# Patient Record
Sex: Female | Born: 1965 | Race: White | Hispanic: No | Marital: Single | Smoking: Never smoker
Health system: Southern US, Community
[De-identification: ages and names within clinical notes are randomized; demographics above are authoritative.]

## PROBLEM LIST (undated history)

## (undated) DIAGNOSIS — I341 Nonrheumatic mitral (valve) prolapse: Secondary | ICD-10-CM

## (undated) DIAGNOSIS — N926 Irregular menstruation, unspecified: Secondary | ICD-10-CM

## (undated) HISTORY — DX: Irregular menstruation, unspecified: N92.6

## (undated) HISTORY — DX: Nonrheumatic mitral (valve) prolapse: I34.1

---

## 1998-11-19 HISTORY — PX: OTHER SURGICAL HISTORY: SHX169

## 1999-02-15 ENCOUNTER — Other Ambulatory Visit: Admission: RE | Admit: 1999-02-15 | Discharge: 1999-02-15 | Payer: Self-pay | Admitting: Obstetrics and Gynecology

## 1999-05-01 ENCOUNTER — Inpatient Hospital Stay (HOSPITAL_COMMUNITY): Admission: RE | Admit: 1999-05-01 | Discharge: 1999-05-03 | Payer: Self-pay | Admitting: Obstetrics and Gynecology

## 2013-09-22 ENCOUNTER — Ambulatory Visit (INDEPENDENT_AMBULATORY_CARE_PROVIDER_SITE_OTHER): Payer: BC Managed Care – PPO | Admitting: Family Medicine

## 2013-09-22 ENCOUNTER — Encounter: Payer: Self-pay | Admitting: Family Medicine

## 2013-09-22 VITALS — BP 118/68 | HR 72 | Temp 98.1°F | Resp 16 | Ht 67.0 in | Wt 191.0 lb

## 2013-09-22 DIAGNOSIS — N926 Irregular menstruation, unspecified: Secondary | ICD-10-CM

## 2013-09-22 DIAGNOSIS — B029 Zoster without complications: Secondary | ICD-10-CM

## 2013-09-22 DIAGNOSIS — R5381 Other malaise: Secondary | ICD-10-CM

## 2013-09-22 LAB — CBC WITH DIFFERENTIAL/PLATELET
Basophils Absolute: 0 10*3/uL (ref 0.0–0.1)
Eosinophils Absolute: 0.2 10*3/uL (ref 0.0–0.7)
Eosinophils Relative: 3 % (ref 0–5)
HCT: 39.9 % (ref 36.0–46.0)
Lymphocytes Relative: 28 % (ref 12–46)
Lymphs Abs: 2 10*3/uL (ref 0.7–4.0)
MCHC: 32.3 g/dL (ref 30.0–36.0)
MCV: 80.9 fL (ref 78.0–100.0)
Monocytes Absolute: 0.5 10*3/uL (ref 0.1–1.0)
Neutrophils Relative %: 62 % (ref 43–77)
Platelets: 311 10*3/uL (ref 150–400)
RBC: 4.93 MIL/uL (ref 3.87–5.11)
RDW: 17.5 % — ABNORMAL HIGH (ref 11.5–15.5)
WBC: 7 10*3/uL (ref 4.0–10.5)

## 2013-09-22 LAB — COMPREHENSIVE METABOLIC PANEL
ALT: 20 U/L (ref 0–35)
AST: 21 U/L (ref 0–37)
Albumin: 4.3 g/dL (ref 3.5–5.2)
BUN: 14 mg/dL (ref 6–23)
CO2: 23 mEq/L (ref 19–32)
Calcium: 9.7 mg/dL (ref 8.4–10.5)
Chloride: 102 mEq/L (ref 96–112)
Creat: 0.94 mg/dL (ref 0.50–1.10)
Sodium: 136 mEq/L (ref 135–145)
Total Bilirubin: 0.3 mg/dL (ref 0.3–1.2)

## 2013-09-22 LAB — POCT URINE PREGNANCY: Preg Test, Ur: NEGATIVE

## 2013-09-22 LAB — TSH: TSH: 5.287 u[IU]/mL — ABNORMAL HIGH (ref 0.350–4.500)

## 2013-09-22 MED ORDER — GABAPENTIN 300 MG PO CAPS: ORAL_CAPSULE | ORAL | Status: DC

## 2013-09-22 MED ORDER — PREDNISONE 20 MG PO TABS: ORAL_TABLET | ORAL | Status: DC

## 2013-09-22 MED ORDER — VALACYCLOVIR HCL 1 G PO TABS: 1000.0000 mg | ORAL_TABLET | Freq: Three times a day (TID) | ORAL | Status: DC

## 2013-09-22 NOTE — Patient Instructions (Signed)
Shingles Shingles (herpes zoster) is an infection that is caused by the same virus that causes chickenpox (varicella). The infection causes a painful skin rash and fluid-filled blisters, which eventually break open, crust over, and heal. It may occur in any area of the body, but it usually affects only one side of the body or face. The pain of shingles usually lasts about 1 month. However, some people with shingles may develop long-term (chronic) pain in the affected area of the body. Shingles often occurs many years after the person had chickenpox. It is more common:  In people older than 50 years.  In people with weakened immune systems, such as those with HIV, AIDS, or cancer.  In people taking medicines that weaken the immune system, such as transplant medicines.  In people under great stress. CAUSES  Shingles is caused by the varicella zoster virus (VZV), which also causes chickenpox. After a person is infected with the virus, it can remain in the person's body for years in an inactive state (dormant). To cause shingles, the virus reactivates and breaks out as an infection in a nerve root. The virus can be spread from person to person (contagious) through contact with open blisters of the shingles rash. It will only spread to people who have not had chickenpox. When these people are exposed to the virus, they may develop chickenpox. They will not develop shingles. Once the blisters scab over, the person is no longer contagious and cannot spread the virus to others. SYMPTOMS  Shingles shows up in stages. The initial symptoms may be pain, itching, and tingling in an area of the skin. This pain is usually described as burning, stabbing, or throbbing.In a few days or weeks, a painful red rash will appear in the area where the pain, itching, and tingling were felt. The rash is usually on one side of the body in a band or belt-like pattern. Then, the rash usually turns into fluid-filled blisters. They  will scab over and dry up in approximately 2 3 weeks. Flu-like symptoms may also occur with the initial symptoms, the rash, or the blisters. These may include:  Fever.  Chills.  Headache.  Upset stomach. DIAGNOSIS  Your caregiver will perform a skin exam to diagnose shingles. Skin scrapings or fluid samples may also be taken from the blisters. This sample will be examined under a microscope or sent to a lab for further testing. TREATMENT  There is no specific cure for shingles. Your caregiver will likely prescribe medicines to help you manage the pain, recover faster, and avoid long-term problems. This may include antiviral drugs, anti-inflammatory drugs, and pain medicines. HOME CARE INSTRUCTIONS   Take a cool bath or apply cool compresses to the area of the rash or blisters as directed. This may help with the pain and itching.   Only take over-the-counter or prescription medicines as directed by your caregiver.   Rest as directed by your caregiver.  Keep your rash and blisters clean with mild soap and cool water or as directed by your caregiver.  Do not pick your blisters or scratch your rash. Apply an anti-itch cream or numbing creams to the affected area as directed by your caregiver.  Keep your shingles rash covered with a loose bandage (dressing).  Avoid skin contact with:  Babies.   Pregnant women.   Children with eczema.   Elderly people with transplants.   People with chronic illnesses, such as leukemia or AIDS.   Wear loose-fitting clothing to help ease   the pain of material rubbing against the rash.  Keep all follow-up appointments with your caregiver.If the area involved is on your face, you may receive a referral for follow-up to a specialist, such as an eye doctor (ophthalmologist) or an ear, nose, and throat (ENT) doctor. Keeping all follow-up appointments will help you avoid eye complications, chronic pain, or disability.  SEEK IMMEDIATE MEDICAL  CARE IF:   You have facial pain, pain around the eye area, or loss of feeling on one side of your face.  You have ear pain or ringing in your ear.  You have loss of taste.  Your pain is not relieved with prescribed medicines.   Your redness or swelling spreads.   You have more pain and swelling.  Your condition is worsening or has changed.   You have a feveror persistent symptoms for more than 2 3 days.  You have a fever and your symptoms suddenly get worse. MAKE SURE YOU:  Understand these instructions.  Will watch your condition.  Will get help right away if you are not doing well or get worse. Document Released: 11/05/2005 Document Revised: 07/30/2012 Document Reviewed: 06/19/2012 [employer] Patient Information 2014 [employer].  

## 2013-09-22 NOTE — Progress Notes (Signed)
[address]   [place], [place]  [address]   [phone]  Subjective:    Patient ID: Jamie Cordova Depolo, female    DOB: 02/21/1966, 47 y.o.   MRN: [id number]  Rash Associated symptoms include fatigue. Pertinent negatives include no diarrhea, fever, shortness of breath or vomiting.   This 47 y.o. female presents as a new patient to establish care and for an acute visit:  1.  L torso rash: onset one week ago; +itching, burning; no tingling; +pimpling rash L upper back; now has developed rash on L upper abdomen; +pain associated with rash; no fever/chills/sweats; +worsening fatigue over the past week; really pushing self to complete daily tasks.    2.  Irregular menses: onset past two years; having menses every 2-3 months; very heavy menses when does have a period.  Condoms for contraception with partner of 14 years.  No recent pap smear in past 15 years.  No regular PCP or gynecologist.    Review of Systems  Constitutional: Positive for fatigue. Negative for fever, chills and diaphoresis.  Respiratory: Negative for shortness of breath, wheezing and stridor.   Gastrointestinal: Negative for nausea, vomiting, abdominal pain and diarrhea.  Genitourinary: Positive for menstrual problem.  Musculoskeletal: Positive for back pain and myalgias.  Skin: Positive for rash.  Neurological: Negative for dizziness, light-headedness and headaches.   Past Medical History  Diagnosis Date  . Mitral valve prolapse   . Menstrual irregularity    Past Surgical History  Procedure Laterality Date  . 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 Erythromycin    No current outpatient prescriptions on file prior to visit.   No current facility-administered medications on file prior to visit.   History   Social History  . Marital Status: Single    Spouse Name: N/A    Number of Children: 0  . Years of Education: N/A   Occupational History  . employed    painting/landscaping/florist work   Social History Main Topics  . Smoking status: Never Smoker   . Smokeless tobacco: Never Used  . Alcohol Use: 7.2 oz/week    12 Glasses of wine per week  . Drug Use: No  . Sexual Activity: Yes    Partners: Male    Birth Control/ Protection: Condom   Other Topics Concern  . Not on file   Social History Narrative   Marital status: single; dating same female x 14 years.       Children: none      Lives: in [place]      Employment: painting/landscaping; previous florist work.      Tobacco: none      Alcohol: 3/4/5 drinks on weekend nights.      Drugs:  none   Family History  Problem Relation Age of Onset  . Hypertension Mother   . Glaucoma Mother   . Hyperlipidemia Mother   . Hypothyroidism Mother   . Hypertension Father   . Hyperlipidemia Father   . Heart disease Father 64    Complete heart block s/p pacemaker  . Alzheimer's disease Maternal Grandmother   . Cancer Maternal Grandfather   . Stroke Paternal Grandmother   . Heart disease Paternal Grandfather   . Hypertension Sister   . Hypothyroidism Sister        Objective:   Physical Exam  Nursing note and vitals reviewed. Constitutional: She is oriented to person, place, and time. She appears well-developed and well-nourished. No distress.  HENT:  Head: Normocephalic and atraumatic.  Eyes: Conjunctivae and EOM are normal. Pupils are equal, round, and reactive to light.  Cardiovascular: Normal rate and regular rhythm.   Murmur heard.  Systolic murmur is present with a grade of 2/6  Pulmonary/Chest: Effort normal and breath sounds normal. She has no wheezes. She has no rales.  Neurological: She is alert and oriented to person, place, and time. No cranial nerve deficit (.diag).  Skin: Rash noted. She is not diaphoretic.  +maculopapular rash L torso along bra line. No vesicles or pustules; no fluctuants; +rash clustered along L thoracic back region.  Psychiatric: She has a normal  mood and affect. Her behavior is normal.   Results for orders placed in visit on 09/22/13  POCT URINE PREGNANCY      Result Value Range   Preg Test, Ur Negative          Assessment & Plan:  Herpes zoster  Other malaise and fatigue - Plan: CBC with Differential, Comprehensive metabolic panel, TSH  Irregular menses - Plan: POCT urine pregnancy  1. Herpes Zoster L thoracic region:  New.  Rx for Valtrex, Prednisone, Neurontin provided.  Discussed diagnosis and treatment plan at length during visit. 2. Fatigue: New. Associated with Herpes Zoster but progressively worsening; obtain labs. Likely due to acute illness but warrants ruling out secondary pathology. 3. Irregular menses:  New. Consistent with perimenopausal state.  Obtain labs including urine pregnancy.  Meds ordered this encounter  Medications  . Multiple Vitamins-Minerals (MULTIVITAMIN WITH MINERALS) tablet    Sig: Take 1 tablet by mouth daily.  . valACYclovir (VALTREX) 1000 MG tablet    Sig: Take 1 tablet (1,000 mg total) by mouth 3 (three) times daily.    Dispense:  21 tablet    Refill:  0  . predniSONE (DELTASONE) 20 MG tablet    Sig: Two tablets daily x 5 days then one tablet daily x 5 days    Dispense:  15 tablet    Refill:  0  . gabapentin (NEURONTIN) 300 MG capsule    Sig: One pill at bedtime x 2 nights then increase to one pill twice daily for two days then increase to one pill three times daily PRN pain    Dispense:  60 capsule    Refill:  1

## 2013-09-30 ENCOUNTER — Telehealth: Payer: Self-pay

## 2013-09-30 NOTE — Telephone Encounter (Signed)
See labs 

## 2013-09-30 NOTE — Telephone Encounter (Signed)
PT STATES WE HAD CALLED HER AND SHE IS RETURNING THE CALL. PLEASE CALL BACK AT [phone]

## 2014-01-27 ENCOUNTER — Ambulatory Visit: Payer: BC Managed Care – PPO

## 2014-01-27 ENCOUNTER — Encounter: Payer: Self-pay | Admitting: Family Medicine

## 2014-01-27 ENCOUNTER — Ambulatory Visit (INDEPENDENT_AMBULATORY_CARE_PROVIDER_SITE_OTHER): Payer: BC Managed Care – PPO | Admitting: Family Medicine

## 2014-01-27 VITALS — BP 183/100 | HR 68 | Temp 98.5°F | Resp 18 | Ht 67.5 in | Wt 201.0 lb

## 2014-01-27 DIAGNOSIS — R5381 Other malaise: Secondary | ICD-10-CM

## 2014-01-27 DIAGNOSIS — R1013 Epigastric pain: Secondary | ICD-10-CM

## 2014-01-27 DIAGNOSIS — Z136 Encounter for screening for cardiovascular disorders: Secondary | ICD-10-CM

## 2014-01-27 DIAGNOSIS — K219 Gastro-esophageal reflux disease without esophagitis: Secondary | ICD-10-CM

## 2014-01-27 DIAGNOSIS — R5383 Other fatigue: Secondary | ICD-10-CM

## 2014-01-27 DIAGNOSIS — Z13220 Encounter for screening for lipoid disorders: Secondary | ICD-10-CM

## 2014-01-27 DIAGNOSIS — M546 Pain in thoracic spine: Secondary | ICD-10-CM

## 2014-01-27 DIAGNOSIS — Z131 Encounter for screening for diabetes mellitus: Secondary | ICD-10-CM

## 2014-01-27 LAB — COMPLETE METABOLIC PANEL WITH GFR
ALK PHOS: 102 U/L (ref 39–117)
ALT: 22 U/L (ref 0–35)
AST: 18 U/L (ref 0–37)
Albumin: 4.6 g/dL (ref 3.5–5.2)
BUN: 16 mg/dL (ref 6–23)
CALCIUM: 10.1 mg/dL (ref 8.4–10.5)
CHLORIDE: 102 meq/L (ref 96–112)
CO2: 25 mEq/L (ref 19–32)
CREATININE: 0.95 mg/dL (ref 0.50–1.10)
GFR, EST NON AFRICAN AMERICAN: 72 mL/min
GFR, Est African American: 83 mL/min
GLUCOSE: 96 mg/dL (ref 70–99)
POTASSIUM: 5 meq/L (ref 3.5–5.3)
Sodium: 139 mEq/L (ref 135–145)
Total Bilirubin: 0.4 mg/dL (ref 0.2–1.2)
Total Protein: 7.7 g/dL (ref 6.0–8.3)

## 2014-01-27 LAB — POCT URINALYSIS DIPSTICK
BILIRUBIN UA: NEGATIVE
Glucose, UA: NEGATIVE
KETONES UA: NEGATIVE
LEUKOCYTES UA: NEGATIVE
Nitrite, UA: NEGATIVE
Protein, UA: NEGATIVE
RBC UA: NEGATIVE
Spec Grav, UA: 1.025
Urobilinogen, UA: 0.2
pH, UA: 5

## 2014-01-27 LAB — CBC WITH DIFFERENTIAL/PLATELET
BASOS PCT: 0 % (ref 0–1)
Basophils Absolute: 0 10*3/uL (ref 0.0–0.1)
Eosinophils Absolute: 0.1 10*3/uL (ref 0.0–0.7)
Eosinophils Relative: 2 % (ref 0–5)
HEMATOCRIT: 44.1 % (ref 36.0–46.0)
HEMOGLOBIN: 14.9 g/dL (ref 12.0–15.0)
Lymphocytes Relative: 27 % (ref 12–46)
Lymphs Abs: 1.8 10*3/uL (ref 0.7–4.0)
MCH: 29.7 pg (ref 26.0–34.0)
MCHC: 33.8 g/dL (ref 30.0–36.0)
MCV: 88 fL (ref 78.0–100.0)
MONO ABS: 0.5 10*3/uL (ref 0.1–1.0)
MONOS PCT: 7 % (ref 3–12)
NEUTROS ABS: 4.2 10*3/uL (ref 1.7–7.7)
Neutrophils Relative %: 64 % (ref 43–77)
Platelets: 276 10*3/uL (ref 150–400)
RBC: 5.01 MIL/uL (ref 3.87–5.11)
RDW: 16.5 % — AB (ref 11.5–15.5)
WBC: 6.5 10*3/uL (ref 4.0–10.5)

## 2014-01-27 LAB — LIPID PANEL
Cholesterol: 390 mg/dL — ABNORMAL HIGH (ref 0–200)
HDL: 57 mg/dL (ref >39)
LDL Cholesterol: 286 mg/dL — ABNORMAL HIGH (ref 0–99)
Total CHOL/HDL Ratio: 6.8 Ratio
Triglycerides: 236 mg/dL — ABNORMAL HIGH (ref <150)
VLDL: 47 mg/dL — ABNORMAL HIGH (ref 0–40)

## 2014-01-27 LAB — HEMOGLOBIN A1C
HEMOGLOBIN A1C: 5.8 % — AB (ref <5.7)
MEAN PLASMA GLUCOSE: 120 mg/dL — AB (ref <117)

## 2014-01-27 LAB — POCT URINE PREGNANCY: Preg Test, Ur: NEGATIVE

## 2014-01-27 MED ORDER — OMEPRAZOLE 20 MG PO CPDR: 20.0000 mg | DELAYED_RELEASE_CAPSULE | Freq: Every day | ORAL | Status: AC

## 2014-01-27 NOTE — Progress Notes (Addendum)
Subjective:   This chart was scribed for Wardell Honour, MD by Forrestine Him, [facility] Scribe. This patient was seen in room 23 and the patient's care was started 2:49 PM.     Patient ID: Jamie Cordova, female    DOB: Jun 25, 1966, 48 y.o.   MRN: [id number]  01/27/2014  Mass and Fatigue   HPI  HPI Comments: Jamie Cordova is a 48 y.o. female who presents to [facility] complaining of ongoing, moderate epigastric abdominal pain onset 2-3 months.  Pt states she was prescribed Gabapentin 300 mg for a shingles diagnoses. She admits to experiencing a  "scattered" feeling along with worsening fatigue with this medication; took medication for 3-4 weeks total. Shingles pain is much improved and nearly resolved.  Recovered from shingles without difficulty.    She reports ongoing, worsening "heaviness" to the chest, chest tender to touch, energy loss, back discomfort brought on by heavy breathing, indigestion, and a pulling feeling in her chest onset 2-3 months. She also states she experiences mild appetite loss and feeling "full" quicker. She states certain movements do not worsen her symptoms, and her symptoms stay the same consistantly. States OTC medications do not help her symptoms. She denies any vomiting, constipation, nausea, diarrhea, SOB, unexpected weight loss, frequency, hematuria, or dysuria at this time.  Feels like a brick in in her upper abdomen/substernal region.  Exertion does not trigger pain or make worse.  Not exercising due to discomfort and has gained ten pounds in past three months.  LNMP: Beginning of January; history of irregular menses. Last Mammogram: Has not had a mammogram in the past    Review of Systems  Constitutional: Positive for appetite change and fatigue. Negative for fever and chills.  HENT: Negative for congestion.   Eyes: Negative for redness.  Respiratory: Positive for chest tightness. Negative for cough,  choking, shortness of breath, wheezing and stridor.   Cardiovascular: Positive for chest pain. Negative for palpitations and leg swelling.  Gastrointestinal: Positive for abdominal pain and abdominal distention. Negative for nausea, vomiting, diarrhea, constipation, blood in stool, anal bleeding and rectal pain.  Genitourinary: Negative for dysuria, urgency, frequency, hematuria and difficulty urinating.  Skin: Negative for rash.  Neurological: Negative for dizziness and weakness.  Psychiatric/Behavioral: Negative for confusion.    Past Medical History  Diagnosis Date  . Mitral valve prolapse   . Menstrual irregularity    Allergies  Allergen Reactions  . Erythromycin    Current Outpatient Prescriptions  Medication Sig Dispense Refill  . Multiple Vitamins-Minerals (MULTIVITAMIN WITH MINERALS) tablet Take 1 tablet by mouth daily.      Marland Kitchen omeprazole (PRILOSEC) 20 MG capsule Take 1 capsule (20 mg total) by mouth daily.  30 capsule  3   No current facility-administered medications for this visit.   History   Social History  . Marital Status: Single    Spouse Name: N/A    Number of Children: 0  . Years of Education: N/A   Occupational History  . employed     painting/landscaping/florist work   Social History Main Topics  . Smoking status: Never Smoker   . Smokeless tobacco: Never Used  . Alcohol Use: 7.2 oz/week    12 Glasses of wine per week  . Drug Use: No  . Sexual Activity: Yes    Partners: Male    Birth Control/ Protection: Condom   Other Topics Concern  . Not on file   Social History  Narrative   Marital status: single; dating same female x 14 years. Now married, 2014      Children: none      Lives: in [place]      Employment: painting/landscaping; previous florist work.      Tobacco: none      Alcohol: 3/4/5 drinks on weekend nights.      Drugs:  none   Family History  Problem Relation Age of Onset  . Hypertension Mother   . Glaucoma Mother   .  Hyperlipidemia Mother   . Hypothyroidism Mother   . Hypertension Father   . Hyperlipidemia Father   . Heart disease Father 42    Complete heart block s/p pacemaker  . Alzheimer's disease Maternal Grandmother   . Cancer Maternal Grandfather   . Stroke Paternal Grandmother   . Heart disease Paternal Grandfather   . Hypertension Sister   . Hypothyroidism Sister        Objective:    BP 183/100  Pulse 68  Temp(Src) 98.5 F (36.9 C)  Resp 18  Ht 5' 7.5" (1.715 m)  Wt 201 lb (91.173 kg)  BMI 31.00 kg/m2  SpO2 100%  LMP 11/23/2013  Physical Exam  Nursing note and vitals reviewed. Constitutional: She is oriented to person, place, and time. She appears well-developed and well-nourished. No distress.  obese  HENT:  Head: Normocephalic and atraumatic.  Mouth/Throat: Oropharynx is clear and moist.  Eyes: Conjunctivae and EOM are normal. Pupils are equal, round, and reactive to light.  Neck: Normal range of motion. Neck supple. No JVD present. No thyromegaly present.  Cardiovascular: Normal rate, regular rhythm, normal heart sounds and intact distal pulses.  Exam reveals no gallop and no friction rub.   No murmur heard. Pulmonary/Chest: Effort normal and breath sounds normal. No respiratory distress. She has no wheezes. She has no rales. She exhibits tenderness.  +TTP L anterior ribs near costochondral junction L>R.  Abdominal: Soft. Bowel sounds are normal. She exhibits no distension and no mass. There is no hepatosplenomegaly. There is tenderness in the epigastric area. There is no rebound, no guarding and no CVA tenderness. No hernia. Hernia confirmed negative in the ventral area.    Tenderness to palpation over the epigastric region at the distal sternal  Musculoskeletal: Normal range of motion. She exhibits tenderness.       Right shoulder: Normal.       Left shoulder: Normal.       Cervical back: Normal.       Thoracic back: Normal. She exhibits normal range of motion, no  tenderness and no bony tenderness.  Lymphadenopathy:    She has no cervical adenopathy.  Neurological: She is alert and oriented to person, place, and time.  Skin: Skin is warm and dry. She is not diaphoretic.  Psychiatric: She has a normal mood and affect. Her behavior is normal.   Results for orders placed in visit on 01/27/14  CBC WITH DIFFERENTIAL      Result Value Ref Range   WBC 6.5  4.0 - 10.5 K/uL   RBC 5.01  3.87 - 5.11 MIL/uL   Hemoglobin 14.9  12.0 - 15.0 g/dL   HCT 44.1  36.0 - 46.0 %   MCV 88.0  78.0 - 100.0 fL   MCH 29.7  26.0 - 34.0 pg   MCHC 33.8  30.0 - 36.0 g/dL   RDW 16.5 (*) 11.5 - 15.5 %   Platelets 276  150 - 400 K/uL   Neutrophils Relative %  64  43 - 77 %   Neutro Abs 4.2  1.7 - 7.7 K/uL   Lymphocytes Relative 27  12 - 46 %   Lymphs Abs 1.8  0.7 - 4.0 K/uL   Monocytes Relative 7  3 - 12 %   Monocytes Absolute 0.5  0.1 - 1.0 K/uL   Eosinophils Relative 2  0 - 5 %   Eosinophils Absolute 0.1  0.0 - 0.7 K/uL   Basophils Relative 0  0 - 1 %   Basophils Absolute 0.0  0.0 - 0.1 K/uL   Smear Review Criteria for review not met    COMPLETE METABOLIC PANEL WITH GFR      Result Value Ref Range   Sodium 139  135 - 145 mEq/L   Potassium 5.0  3.5 - 5.3 mEq/L   Chloride 102  96 - 112 mEq/L   CO2 25  19 - 32 mEq/L   Glucose, Bld 96  70 - 99 mg/dL   BUN 16  6 - 23 mg/dL   Creat 0.95  0.50 - 1.10 mg/dL   Total Bilirubin 0.4  0.2 - 1.2 mg/dL   Alkaline Phosphatase 102  39 - 117 U/L   AST 18  0 - 37 U/L   ALT 22  0 - 35 U/L   Total Protein 7.7  6.0 - 8.3 g/dL   Albumin 4.6  3.5 - 5.2 g/dL   Calcium 10.1  8.4 - 10.5 mg/dL   GFR, Est African American 83     GFR, Est Non African American 72    TSH      Result Value Ref Range   TSH 3.318  0.350 - 4.500 uIU/mL  HEMOGLOBIN A1C      Result Value Ref Range   Hemoglobin A1C 5.8 (*) <5.7 %   Mean Plasma Glucose 120 (*) <117 mg/dL  LIPID PANEL      Result Value Ref Range   Cholesterol 390 (*) 0 - 200 mg/dL    Triglycerides 236 (*) <150 mg/dL   HDL 57  >39 mg/dL   Total CHOL/HDL Ratio 6.8     VLDL 47 (*) 0 - 40 mg/dL   LDL Cholesterol 286 (*) 0 - 99 mg/dL  POCT URINALYSIS DIPSTICK      Result Value Ref Range   Color, UA yellow     Clarity, UA clear     Glucose, UA neg     Bilirubin, UA neg     Ketones, UA neg     Spec Grav, UA 1.025     Blood, UA neg     pH, UA 5.0     Protein, UA neg     Urobilinogen, UA 0.2     Nitrite, UA neg     Leukocytes, UA Negative    POCT URINE PREGNANCY      Result Value Ref Range   Preg Test, Ur Negative     [facility] reading (PRIMARY) by  Dr. Tamala Julian.  AAS:  NAD      Assessment & Plan:  Abdominal pain, epigastric - Plan: POCT urinalysis dipstick, EKG 12-Lead, DG Abd Acute W/Chest, POCT urine pregnancy, CT Abdomen Pelvis W Contrast, omeprazole (PRILOSEC) 20 MG capsule, CANCELED: DG Abd Acute W/Chest  Thoracic back pain  Esophageal reflux  Other malaise and fatigue - Plan: CBC with Differential, COMPLETE METABOLIC PANEL WITH GFR, TSH, CANCELED: DG Abd Acute W/Chest  Screening for diabetes mellitus - Plan: Hemoglobin A1c  Encounter for lipid screening for cardiovascular  disease - Plan: Lipid panel  1. Abdominal pain epigastric:  New.  Duration three months; obtain labs; obtain CT abd/pelvis to rule out hernia or mass.  Rx for Prilosec provided to take daily.  If no improvement with Prilosec and if CT negative, refer to GI.   2.  Malaise and fatigue:  New. Associated with epigastric pain; EKG normal; obtain labs.  Urine pregnancy negative. 3.  GERD: New.  Rx for Prilosec provided.   4.  Thoracic back pain:  New. Associated with above symptoms; benign musculoskeletal exam. Monitor; recommend rest, stretching. 5. Screening DMII: obtain labs. 6.  Screening lipid disorder:  Obtain FLP; both parents with hyperlipidemia.  Meds ordered this encounter  Medications  . omeprazole (PRILOSEC) 20 MG capsule    Sig: Take 1 capsule (20 mg total) by mouth daily.     Dispense:  30 capsule    Refill:  3    No Follow-up on file.    I personally performed the services described in this documentation, which was scribed in my presence.  The recorded information has been reviewed and is accurate.  Reginia Forts, M.D.  [facility] [address] [place], [place]  [address] [phone] phone [phone] fax

## 2014-01-27 NOTE — Progress Notes (Signed)
   Subjective:    Patient ID: Jamie Cordova, female    DOB: October 18, 1966, 48 y.o.   MRN: [id number]  HPI    Review of Systems  Constitutional: Positive for activity change.  HENT: Positive for sinus pressure.   Respiratory: Positive for chest tightness.   Genitourinary: Positive for menstrual problem.  Musculoskeletal: Positive for back pain.  Neurological: Positive for numbness.       Objective:   Physical Exam        Assessment & Plan:

## 2014-01-28 LAB — TSH: TSH: 3.318 u[IU]/mL (ref 0.350–4.500)

## 2014-02-03 MED ORDER — ATORVASTATIN CALCIUM 10 MG PO TABS: 10.0000 mg | ORAL_TABLET | Freq: Every day | ORAL | Status: AC

## 2014-02-03 NOTE — Addendum Note (Signed)
Addended by: Ethelda ChickSMITH, Imagene Boss M on: 02/03/2014 03:23 PM   Modules accepted: Orders

## 2014-02-04 ENCOUNTER — Ambulatory Visit
Admission: RE | Admit: 2014-02-04 | Discharge: 2014-02-04 | Disposition: A | Discharge status: Home / Self Care | Payer: BC Managed Care – PPO | Source: Ambulatory Visit | Attending: Family Medicine | Admitting: Family Medicine

## 2014-02-04 ENCOUNTER — Telehealth: Payer: Self-pay

## 2014-02-04 DIAGNOSIS — R1013 Epigastric pain: Secondary | ICD-10-CM

## 2014-02-04 MED ORDER — IOHEXOL 300 MG/ML  SOLN
125.0000 mL | Freq: Once | INTRAMUSCULAR | Status: AC | PRN
  Administered 2014-02-04: 125 mL via INTRAVENOUS

## 2014-02-12 ENCOUNTER — Other Ambulatory Visit: Payer: Self-pay | Admitting: Family Medicine

## 2014-02-12 DIAGNOSIS — R1900 Intra-abdominal and pelvic swelling, mass and lump, unspecified site: Secondary | ICD-10-CM

## 2014-02-12 DIAGNOSIS — K219 Gastro-esophageal reflux disease without esophagitis: Secondary | ICD-10-CM

## 2014-02-12 DIAGNOSIS — R1013 Epigastric pain: Secondary | ICD-10-CM

## 2014-03-01 NOTE — Telephone Encounter (Signed)
No msg °

## 2014-05-19 ENCOUNTER — Encounter: Payer: Self-pay | Admitting: Family Medicine

## 2014-05-19 DIAGNOSIS — R0789 Other chest pain: Secondary | ICD-10-CM

## 2014-05-19 DIAGNOSIS — M546 Pain in thoracic spine: Secondary | ICD-10-CM

## 2014-05-20 MED ORDER — HYDROCHLOROTHIAZIDE 12.5 MG PO TABS: 12.5000 mg | ORAL_TABLET | Freq: Every day | ORAL | Status: AC

## 2014-05-20 NOTE — Telephone Encounter (Signed)
Scheduling ---- please schedule OV with me in 1-3 weeks for high blood pressure

## 2014-05-21 ENCOUNTER — Telehealth: Payer: Self-pay | Admitting: Family Medicine

## 2014-05-21 NOTE — Telephone Encounter (Signed)
Left a message for patient to return call to schedule a 3 week follow up appointment with Dr.Smith.

## 2014-05-21 NOTE — Telephone Encounter (Signed)
Message copied by Lucia GaskinsHAAS, Shadman Tozzi C on Fri May 21, 2014  9:43 AM ------      Message from: Ethelda ChickSMITH, KRISTI M      Created: Thu May 20, 2014  8:28 PM       Please schedule patient follow-up OV with me in three weeks for elevated blood pressure. I may have already sent a message about this patient's need for an appointment?            Thanks,      Kristi ------

## 2014-05-26 ENCOUNTER — Encounter: Payer: Self-pay | Admitting: Family Medicine

## 2014-06-01 MED ORDER — METOPROLOL SUCCINATE ER 25 MG PO TB24
25.0000 mg | ORAL_TABLET | Freq: Every day | ORAL | Status: AC
Start: 2014-06-01 — End: ?

## 2014-06-14 ENCOUNTER — Ambulatory Visit: Payer: Self-pay | Admitting: Family Medicine

## 2014-06-28 ENCOUNTER — Ambulatory Visit (INDEPENDENT_AMBULATORY_CARE_PROVIDER_SITE_OTHER): Payer: BC Managed Care – PPO | Admitting: Family Medicine

## 2014-06-28 ENCOUNTER — Encounter: Payer: Self-pay | Admitting: Family Medicine

## 2014-06-28 VITALS — BP 160/97 | HR 68 | Temp 98.4°F | Resp 16 | Ht 67.0 in | Wt 195.4 lb

## 2014-06-28 DIAGNOSIS — E7800 Pure hypercholesterolemia, unspecified: Secondary | ICD-10-CM

## 2014-06-28 DIAGNOSIS — I10 Essential (primary) hypertension: Secondary | ICD-10-CM

## 2014-06-28 DIAGNOSIS — E669 Obesity, unspecified: Secondary | ICD-10-CM

## 2014-06-28 DIAGNOSIS — M940 Chondrocostal junction syndrome [Tietze]: Secondary | ICD-10-CM

## 2014-06-28 LAB — CBC WITH DIFFERENTIAL/PLATELET
BASOS ABS: 0.1 10*3/uL (ref 0.0–0.1)
BASOS PCT: 1 % (ref 0–1)
Eosinophils Absolute: 0.2 10*3/uL (ref 0.0–0.7)
Eosinophils Relative: 3 % (ref 0–5)
HEMATOCRIT: 45.3 % (ref 36.0–46.0)
HEMOGLOBIN: 15.5 g/dL — AB (ref 12.0–15.0)
Lymphocytes Relative: 26 % (ref 12–46)
Lymphs Abs: 1.8 10*3/uL (ref 0.7–4.0)
MCH: 31.6 pg (ref 26.0–34.0)
MCHC: 34.2 g/dL (ref 30.0–36.0)
MCV: 92.3 fL (ref 78.0–100.0)
MONO ABS: 0.4 10*3/uL (ref 0.1–1.0)
MONOS PCT: 6 % (ref 3–12)
Neutro Abs: 4.5 10*3/uL (ref 1.7–7.7)
Neutrophils Relative %: 64 % (ref 43–77)
Platelets: 273 10*3/uL (ref 150–400)
RBC: 4.91 MIL/uL (ref 3.87–5.11)
RDW: 14.6 % (ref 11.5–15.5)
WBC: 7.1 10*3/uL (ref 4.0–10.5)

## 2014-06-28 LAB — POCT URINALYSIS DIPSTICK
Bilirubin, UA: NEGATIVE
Glucose, UA: NEGATIVE
Ketones, UA: NEGATIVE
Leukocytes, UA: NEGATIVE
NITRITE UA: NEGATIVE
Protein, UA: NEGATIVE
Spec Grav, UA: 1.02
UROBILINOGEN UA: 0.2
pH, UA: 5

## 2014-06-28 NOTE — Progress Notes (Signed)
This chart was scribed for [name], MD by Andrew Au, ED Scribe. This patient was seen in room 26 and the patient's care was started at 12:11 PM. Subjective:    Patient ID: Jamie Cordova, female    DOB: Mar 04, 1966, 48 y.o.   MRN: [id number]  HPI Chief Complaint  Patient presents with  . Follow-up    hypertension  . Chest Pain  . Hyperlipidemia     HPI Comments: Jamie Cordova is a 48 y.o. female who presents to the [facility] f/u. Pt was last seen 5 months ago for epigastric abdominal pain. At that time CBC normal, CMET normal, eleveted cholesterol, pregnancy test was negative, acute abdominal series was normal. Pt was recommended to CT abdomen, prescribed Prilosec and referred her to GI. Pt was prescribe Lipitor for cholesterol. Fibroids were found in uterus on CT abd/pelvis.  Requested referall to [facility] and was seen 6/16. Pt gastro, Dr. Lavon Paganini did not identify a GI source of painand thought it to be costochondritis and referred to orthopedist or pulmonologist. Pt was referred to an ortho at [facility] who thought  she was suffering from costochondritis. Pt was told to consider an MRI of chest if symptoms did not improve in 2-3 months. She had an Xray of thoracic spine which showed degenerative changed to t7 and t8. She also had mild degenerative c spine and l spine. Pt emailed me about elevated bp. Sent in  HCTZ and metoprolol.  Pt was intolerant to HCTZ; has not started Metoprolol.    Pt has been concerned with BP. She reports her BP at Dr. Lavon Paganini office was 193/118 but has improved since then. Pt has been keeping track of her BP.  Reports she still has unchanged sternal/xiphoid pain causing her body distress. She reports area is often pained with aggravation to area and with deep breathes. Pt worsened this pain while mowing lawns with a push mower aggravating this pain. Pt was thinking about taking medication for this pain but reports the medication causes  elevated BP. Pt denies taking pain medication at this time. Pt states she has not started metoprolol or Lipitor. Per elevated cholesterol, pt denies eating fast food or greases food. She reports family history of hypercholesterolemia. Pt has been drinking potassium shakes, and eating fruits. She denies exercise. Pt denies SOB. Pt does not like taking medication and would much prefer to exercise and diet. Pt denies bladder and bowel incontinence. She denies decrease in appetite.  Pt states that she is at the heaviest that she has ever been. She report she is not used to carrying as much weight as she is now. Pt is hoping to lose weight.    Pt is fasting   Pt reports increased sweats and hot flashes. She reports she may be going through menopause.   Past Medical History  Diagnosis Date  . Mitral valve prolapse   . Menstrual irregularity    Past Surgical History  Procedure Laterality Date  . 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 Authorizing Provider  Multiple Vitamins-Minerals (MULTIVITAMIN WITH MINERALS) tablet Take 1 tablet by mouth daily.   Yes Historical Provider, MD  atorvastatin (LIPITOR) 10 MG tablet Take 1 tablet (10 mg total) by mouth daily at 6 PM. 02/03/14   [name], MD  hydrochlorothiazide (HYDRODIURIL) 12.5 MG tablet Take 1 tablet (12.5 mg total) by mouth daily. 05/20/14  Ethelda ChickKristi M Genella Bas, MD  metoprolol succinate (TOPROL-XL) 25 MG 24 hr tablet Take 1 tablet (25 mg total) by mouth daily. 06/01/14   Ethelda ChickKristi M Jceon Alverio, MD  omeprazole (PRILOSEC) 20 MG capsule Take 1 capsule (20 mg total) by mouth daily. 01/27/14   Ethelda ChickKristi M Aashish Hamm, MD    Review of Systems  Constitutional: Positive for fatigue. Negative for fever, chills, diaphoresis and appetite change.  Eyes: Negative for visual disturbance.  Respiratory: Negative for cough and shortness of breath.   Cardiovascular: Positive for chest pain. Negative for  palpitations and leg swelling.  Gastrointestinal: Negative for nausea, vomiting, abdominal pain, diarrhea, constipation and blood in stool.  Endocrine: Negative for cold intolerance, heat intolerance, polydipsia, polyphagia and polyuria.  Neurological: Negative for dizziness, tremors, seizures, syncope, facial asymmetry, speech difficulty, weakness, light-headedness, numbness and headaches.  Psychiatric/Behavioral: The patient is nervous/anxious.     Objective:   Physical Exam  Nursing note and vitals reviewed. Constitutional: She is oriented to person, place, and time. She appears well-developed and well-nourished. No distress.  HENT:  Head: Normocephalic and atraumatic.  Right Ear: External ear normal.  Left Ear: External ear normal.  Nose: Nose normal.  Mouth/Throat: Oropharynx is clear and moist.  Eyes: Conjunctivae and EOM are normal. Pupils are equal, round, and reactive to light.  Neck: Normal range of motion. Neck supple. Carotid bruit is not present. No thyromegaly present.  Cardiovascular: Normal rate, regular rhythm, normal heart sounds and intact distal pulses.  Exam reveals no gallop and no friction rub.   No murmur heard. BP- 142/100  Pulmonary/Chest: Effort normal and breath sounds normal. No respiratory distress. She has no wheezes. She has no rales. She exhibits tenderness.  +TTP to distal sternum at xiphoid process   Abdominal: Soft. Bowel sounds are normal. She exhibits no distension and no mass. There is no tenderness. There is no rebound and no guarding.  Musculoskeletal: Normal range of motion.       Right shoulder: Normal.       Left shoulder: Normal.       Cervical back: Normal.  Lymphadenopathy:    She has no cervical adenopathy.  Neurological: She is alert and oriented to person, place, and time. No cranial nerve deficit.  Skin: Skin is warm and dry. No rash noted. She is not diaphoretic. No erythema. No pallor.  Psychiatric: She has a normal mood and affect.  Her behavior is normal.   Filed Vitals:   06/28/14 1146  BP: 160/97  Pulse: 68  Temp: 98.4 F (36.9 C)  Resp: 16    Assessment & Plan:    1. Essential hypertension, benign   2. Pure hypercholesterolemia   3. Costochondritis   4. Obesity, unspecified    1. HTN: uncontrolled but slowly improving with weight loss, dietary modification, exercise. Continue to monitor closely; pt refuses medication at this time. 2.  Hypercholesterolemia: uncontrolled; repeat today. 3.  Costochondritis: persistent without improvement; recommend follow-up with ortho to discuss and order MRI chest as discussed at recent visit. Also recommend pt avoid cutting grass with push mower for the remainder of the summer; this activity tends to worsen pain. 4.  Obesity: improving with weight loss.    No orders of the defined types were placed in this encounter.   I personally performed the services described in this documentation, which was scribed in my presence.  The recorded information has been reviewed and is accurate.  Nilda SimmerKristi Salaam Battershell, M.D.  [facility]  [place]  [address] [place],   [id number] [phone] phone [phone] fax

## 2014-06-29 LAB — LIPID PANEL
Cholesterol: 329 mg/dL — ABNORMAL HIGH (ref 0–200)
HDL: 65 mg/dL (ref >39)
LDL CALC: 224 mg/dL — AB (ref 0–99)
Total CHOL/HDL Ratio: 5.1 Ratio
Triglycerides: 201 mg/dL — ABNORMAL HIGH (ref <150)
VLDL: 40 mg/dL (ref 0–40)

## 2014-06-29 LAB — COMPREHENSIVE METABOLIC PANEL
ALK PHOS: 104 U/L (ref 39–117)
ALT: 20 U/L (ref 0–35)
AST: 17 U/L (ref 0–37)
Albumin: 4.8 g/dL (ref 3.5–5.2)
BUN: 14 mg/dL (ref 6–23)
CO2: 26 mEq/L (ref 19–32)
Calcium: 10.1 mg/dL (ref 8.4–10.5)
Chloride: 104 mEq/L (ref 96–112)
Creat: 1.01 mg/dL (ref 0.50–1.10)
Glucose, Bld: 100 mg/dL — ABNORMAL HIGH (ref 70–99)
Potassium: 4.9 mEq/L (ref 3.5–5.3)
Sodium: 139 mEq/L (ref 135–145)
Total Bilirubin: 0.3 mg/dL (ref 0.2–1.2)
Total Protein: 7.5 g/dL (ref 6.0–8.3)

## 2014-09-27 ENCOUNTER — Ambulatory Visit: Payer: BC Managed Care – PPO | Admitting: Family Medicine

## 2014-12-06 IMAGING — CT CT ABD-PELV W/ CM
2 of 5 series · 9 of 36 positions shown, 15 images · IV contrast (READICAT/WATER & [ID] OMNI 300)
Comparison: Abdomen films of 01/27/2014

CLINICAL DATA: Epigastric discomfort with palpable mass for 3
months

EXAM:
CT ABDOMEN AND PELVIS WITH CONTRAST
TECHNIQUE: Multidetector CT imaging of the abdomen and pelvis was performed
using the standard protocol following bolus administration of
intravenous contrast.
CONTRAST:  125mL OMNIPAQUE IOHEXOL 300 MG/ML  SOLN

[Series 3: abd/pelvis with · axial · 0.74mm/px · z∈[-378,-38]mm · 8 of 85 slices shown, 13 images]
[im 10/85  soft-tissue]
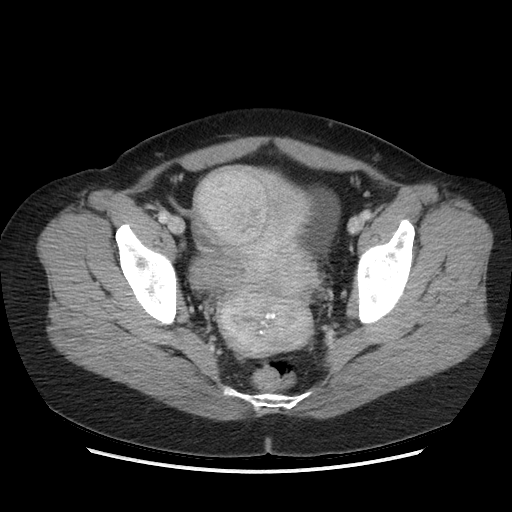
[im 10/85  bone]
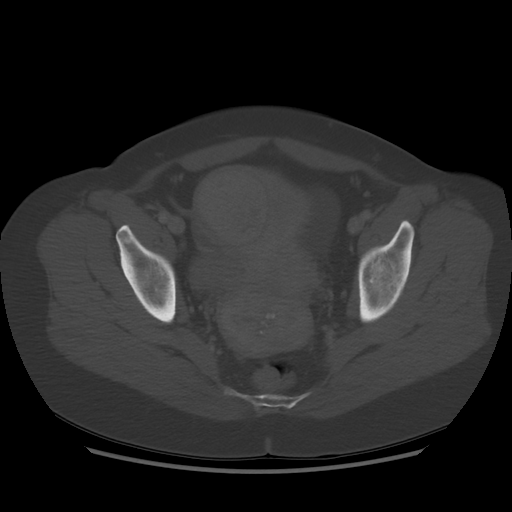
[im 19/85  soft-tissue]
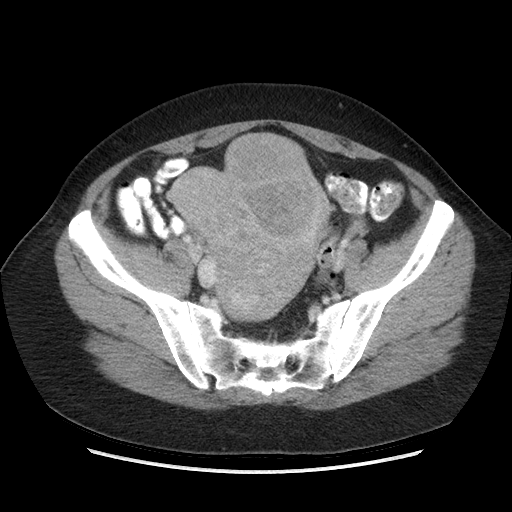
[im 29/85  soft-tissue]
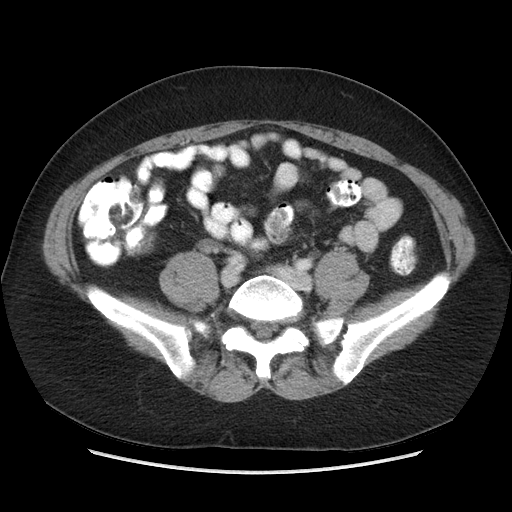
[im 38/85  soft-tissue]
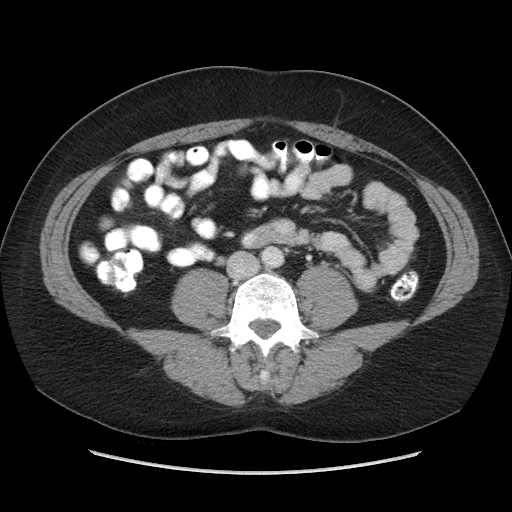
[im 47/85  soft-tissue]
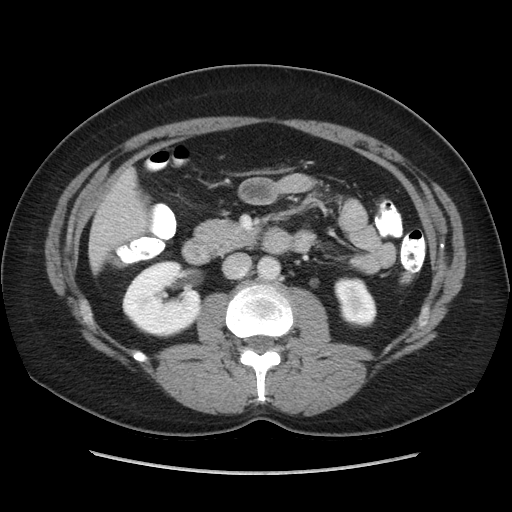
[im 47/85  lung]
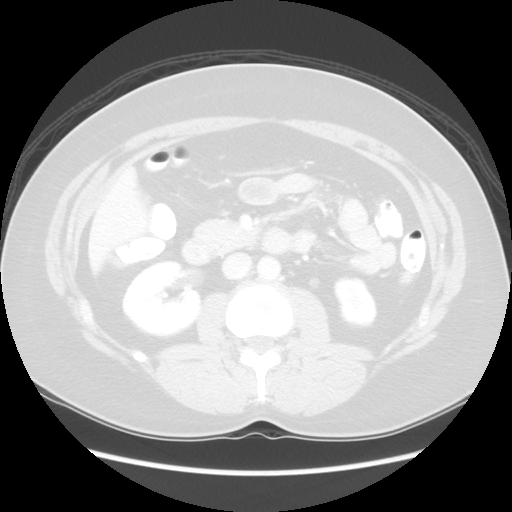
[im 57/85  soft-tissue]
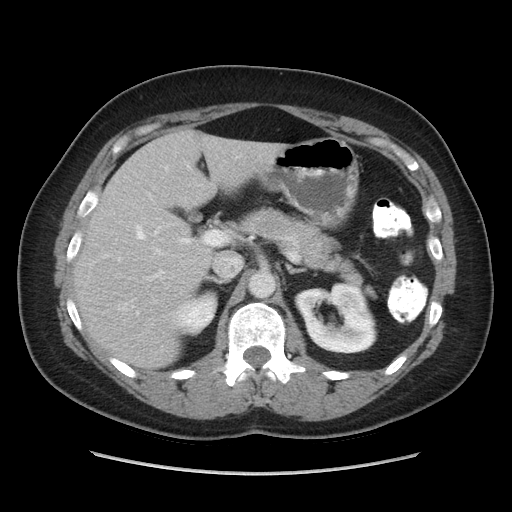
[im 57/85  lung]
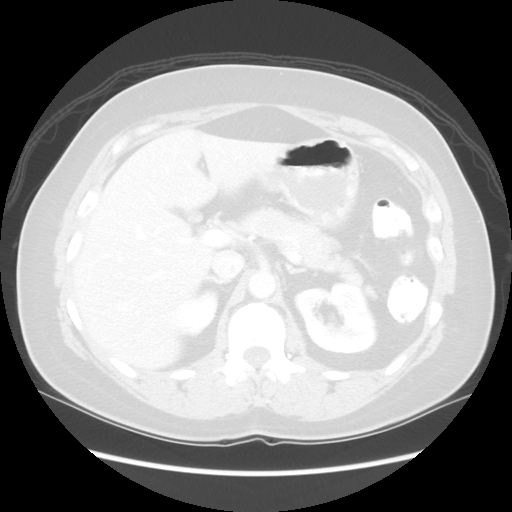
[im 66/85  soft-tissue]
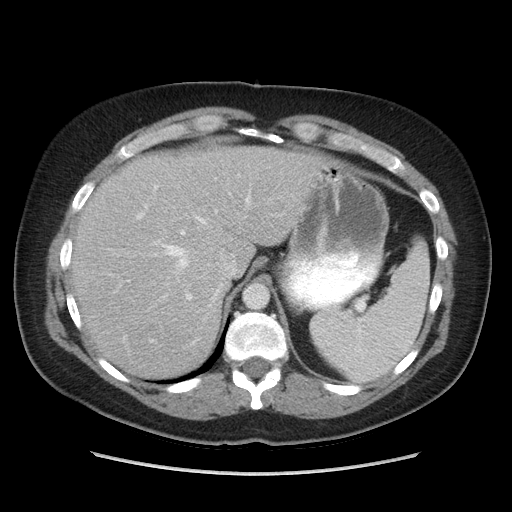
[im 66/85  lung]
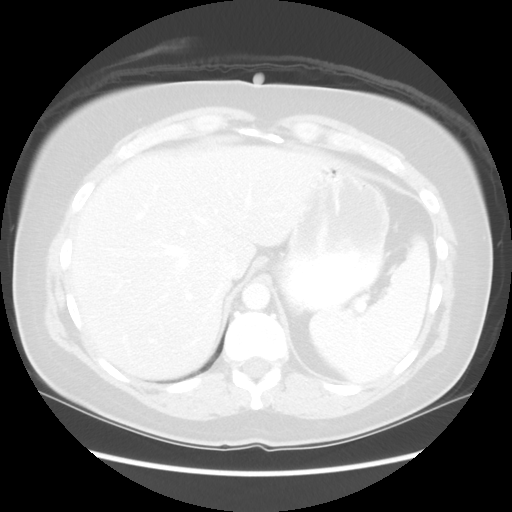
[im 75/85  soft-tissue]
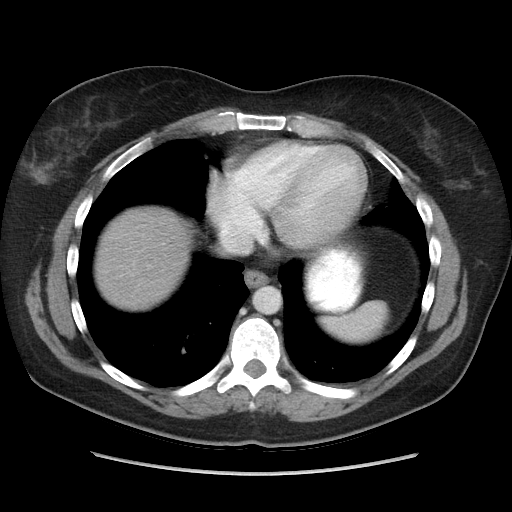
[im 75/85  lung]
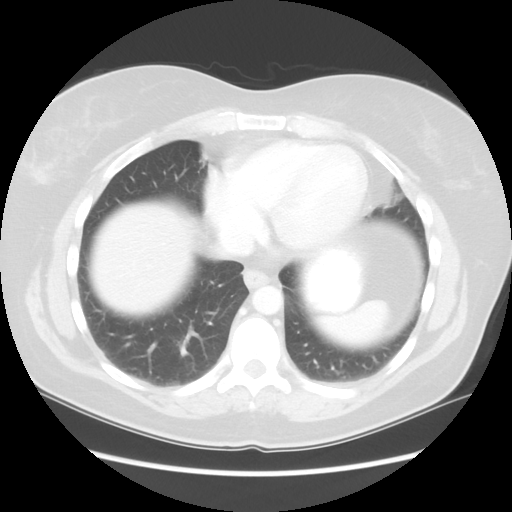

[Series 601: coronal body · coronal · 0.99mm/px · 1 of 120 slices shown, 2 images]
[im 40/120  soft-tissue]
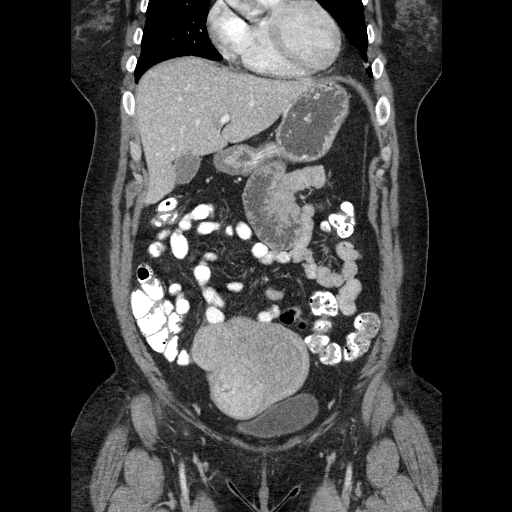
[im 40/120  bone]
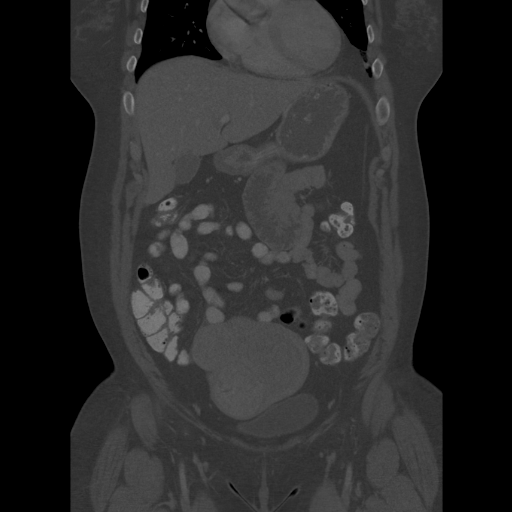

[9 of 36 positions shown; findings below may reference images not displayed]

FINDINGS: The lung bases are clear. The vitamin E marker was placed over the
area of palpable abnormality over the epigastrium. This corresponds
to the xiphoid process. No underlying mass or hernia is seen. The
liver enhances with no focal abnormality and no ductal dilatation is
seen. No calcified gallstones are noted. The pancreas is normal in
size and the pancreatic duct is not dilated. The adrenal glands and
spleen are unremarkable. The stomach is moderately fluid distended
with no abnormality noted. The kidneys enhance with no calculus or
mass and no hydronephrosis is seen on delayed images. The proximal
ureters are normal in caliber. The abdominal aorta is normal in
caliber. No adenopathy is seen

Within the mid pelvis there is an enlarged lobular uterus consistent
with multi fibroid uterus measuring 15.3 x 11.1 x 10.6 cm. This
enlarged fibroid uterus compresses the urinary bladder. Coarse
calcifications are noted within 1 fibroid posteriorly and
inferiorly. No definite adnexal lesion is seen although the adnexa
are not well visualized due to the large uterus. No free fluid is
seen within the pelvis. No abnormality of the colon is seen. The
terminal ileum is unremarkable. The appendix is not well seen but no
inflammatory process is noted within the right lower quadrant. The
lumbar vertebrae are in normal alignment.
IMPRESSION: 1. Enlarged multi fibroid uterus as described above.  No free fluid.
2. The vitamin E capsule corresponds to the xiphoid process over the
epigastrium. No underlying mass or hernia is seen.

## 2020-02-06 ENCOUNTER — Ambulatory Visit: Payer: Self-pay | Attending: Internal Medicine

## 2020-02-06 DIAGNOSIS — Z23 Encounter for immunization: Secondary | ICD-10-CM

## 2020-02-06 NOTE — Progress Notes (Signed)
   Covid-19 Vaccination Clinic  Name:  Jamie Cordova    MRN: [id number] DOB: Aug 03, 1966  02/06/2020  Jamie Cordova was observed post Covid-19 immunization for 15 minutes without incident. She was provided with Vaccine Information Sheet and instruction to access the V-Safe system.   Jamie Cordova was instructed to call 911 with any severe reactions post vaccine: Marland Kitchen Difficulty breathing  . Swelling of face and throat  . A fast heartbeat  . A bad rash all over body  . Dizziness and weakness   Immunizations Administered    Name Date Dose VIS Date Route   Pfizer COVID-19 Vaccine 02/06/2020  5:24 PM 0.3 mL 10/30/2019 Intramuscular   Manufacturer: [employer], [employer]   Lot: [id number]   NDC: [address]-3

## 2020-02-27 ENCOUNTER — Ambulatory Visit: Payer: Self-pay | Attending: Internal Medicine

## 2020-02-27 DIAGNOSIS — Z23 Encounter for immunization: Secondary | ICD-10-CM

## 2020-02-27 NOTE — Progress Notes (Signed)
   Covid-19 Vaccination Clinic  Name:  Jamie Cordova    MRN: [id number] DOB: 1966-08-01  02/27/2020  [name] was observed post Covid-19 immunization for 15 minutes without incident. She was provided with Vaccine Information Sheet and instruction to access the V-Safe system.   [name] was instructed to call 911 with any severe reactions post vaccine: Marland Kitchen Difficulty breathing  . Swelling of face and throat  . A fast heartbeat  . A bad rash all over body  . Dizziness and weakness   Immunizations Administered    Name Date Dose VIS Date Route   Pfizer COVID-19 Vaccine 02/27/2020  4:54 PM 0.3 mL 10/30/2019 Intramuscular   Manufacturer: [employer], [employer]   Lot: [id number]   NDC: [address]-1
